# Patient Record
Sex: Male | Born: 1985 | Race: White | Hispanic: No | Marital: Single | Smoking: Current every day smoker
Health system: Southern US, Community
[De-identification: ages and names within clinical notes are randomized; demographics above are authoritative.]

---
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
## 2003-04-13 ENCOUNTER — Emergency Department (HOSPITAL_COMMUNITY): Admission: EM | Admit: 2003-04-13 | Discharge: 2003-04-13 | Payer: Self-pay | Admitting: *Deleted

## 2003-04-18 ENCOUNTER — Emergency Department (HOSPITAL_COMMUNITY): Admission: EM | Admit: 2003-04-18 | Discharge: 2003-04-18 | Payer: Self-pay | Admitting: Emergency Medicine

## 2003-07-14 ENCOUNTER — Encounter: Payer: Self-pay | Admitting: Family Medicine

## 2003-07-14 ENCOUNTER — Ambulatory Visit (HOSPITAL_COMMUNITY): Admission: RE | Admit: 2003-07-14 | Discharge: 2003-07-14 | Payer: Self-pay | Admitting: Family Medicine

## 2004-03-27 ENCOUNTER — Emergency Department (HOSPITAL_COMMUNITY): Admission: EM | Admit: 2004-03-27 | Discharge: 2004-03-27 | Payer: Self-pay

## 2004-08-11 ENCOUNTER — Emergency Department (HOSPITAL_COMMUNITY): Admission: EM | Admit: 2004-08-11 | Discharge: 2004-08-11 | Payer: Self-pay | Admitting: Emergency Medicine

## 2009-02-19 ENCOUNTER — Emergency Department (HOSPITAL_COMMUNITY): Admission: AC | Admit: 2009-02-19 | Discharge: 2009-02-20 | Payer: Self-pay | Admitting: Emergency Medicine

## 2009-04-20 ENCOUNTER — Emergency Department (HOSPITAL_COMMUNITY): Admission: EM | Admit: 2009-04-20 | Discharge: 2009-04-20 | Payer: Self-pay | Admitting: Emergency Medicine
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
## 2011-01-04 LAB — CBC
HCT: 44.8 % (ref 39.0–52.0)
Hemoglobin: 15.4 g/dL (ref 13.0–17.0)
MCHC: 34.3 g/dL (ref 30.0–36.0)
MCV: 88.1 fL (ref 78.0–100.0)
RBC: 5.08 MIL/uL (ref 4.22–5.81)
WBC: 9 10*3/uL (ref 4.0–10.5)

## 2011-01-04 LAB — SAMPLE TO BLOOD BANK

## 2021-01-01 ENCOUNTER — Other Ambulatory Visit: Payer: Self-pay

## 2021-01-01 ENCOUNTER — Encounter: Payer: Self-pay | Admitting: Emergency Medicine

## 2021-01-01 ENCOUNTER — Ambulatory Visit
Admission: EM | Admit: 2021-01-01 | Discharge: 2021-01-01 | Disposition: A | Discharge status: Home / Self Care | Payer: 59 | Attending: Internal Medicine | Admitting: Internal Medicine

## 2021-01-01 DIAGNOSIS — J301 Allergic rhinitis due to pollen: Secondary | ICD-10-CM

## 2021-01-01 MED ORDER — FEXOFENADINE HCL 180 MG PO TABS: 180.0000 mg | ORAL_TABLET | Freq: Every day | ORAL | 0 refills | Status: DC

## 2021-01-01 MED ORDER — FLUTICASONE PROPIONATE 50 MCG/ACT NA SUSP: 1.0000 | Freq: Two times a day (BID) | NASAL | 0 refills | Status: DC

## 2021-01-01 NOTE — ED Provider Notes (Signed)
[facility]    CSN: [id number] Arrival date & time: 01/01/21  1041      History   Chief Complaint No chief complaint on file.   HPI Alex Hardy is a 35 y.o. male who presents with nose congestion, sinus pressure x 2 days. Has not been sick at all in the past few weeks. Denies noticing itchy throat or nose or sneezing. Denies body aches, fever, fatigue, or GI symptoms. Has tried OTC meds without help.  Has been doing Netie pot rinses.    History reviewed. No pertinent past medical history.  There are no problems to display for this patient.   History reviewed. No pertinent surgical history.   Home Medications    Prior to Admission medications   Medication Sig Start Date End Date Taking? Authorizing Provider  fexofenadine (ALLEGRA ALLERGY) 180 MG tablet Take 1 tablet (180 mg total) by mouth daily. 01/01/21  Yes Rodriguez-Southworth, Nettie Elm, PA-C  fluticasone (FLONASE) 50 MCG/ACT nasal spray Place 1 spray into both nostrils in the morning and at bedtime. 01/01/21  Yes Rodriguez-Southworth, Viviana Simpler    Family History History reviewed. No pertinent family history.  Social History Social History   Tobacco Use  . Smoking status: Current Every Day Smoker    Packs/day: 0.50    Types: Cigarettes  . Smokeless tobacco: Never Used  Vaping Use  . Vaping Use: Never used  Substance Use Topics  . Alcohol use: Yes    Comment: 2 to 3 times a week  . Drug use: Never     Allergies   Patient has no known allergies.   Review of Systems Review of Systems  Constitutional: Negative for activity change, appetite change, chills, diaphoresis, fatigue and fever.  HENT: Positive for congestion, postnasal drip and rhinorrhea. Negative for ear discharge, ear pain, sneezing, sore throat and trouble swallowing.   Eyes: Negative for discharge.  Respiratory: Positive for cough. Negative for chest tightness and shortness of breath.   Cardiovascular: Negative for chest pain.   Gastrointestinal: Negative for diarrhea, nausea and vomiting.  Musculoskeletal: Negative for myalgias.  Skin: Negative for rash.  Allergic/Immunologic: Negative for environmental allergies.  Neurological: Negative for headaches.     Physical Exam Triage Vital Signs ED Triage Vitals  Enc Vitals Group     BP 01/01/21 1050 (!) 142/80     Pulse Rate 01/01/21 1050 (!) 110     Resp 01/01/21 1050 20     Temp 01/01/21 1050 98.1 F (36.7 C)     Temp Source 01/01/21 1050 Oral     SpO2 01/01/21 1050 97 %     Weight --      Height --      Head Circumference --      Peak Flow --      Pain Score 01/01/21 1052 7     Pain Loc --      Pain Edu? --      Excl. in GC? --    No data found.  Updated Vital Signs BP (!) 142/80   Pulse (!) 110   Temp 98.1 F (36.7 C) (Oral)   Resp 20   SpO2 97%   Visual Acuity Right Eye Distance:   Left Eye Distance:   Bilateral Distance:    Right Eye Near:   Left Eye Near:    Bilateral Near:     Physical Exam Alert pt NAD who seems nasally congested EYES- non icterus, mild watering, no purulent drainage NOSE- moderate mucosa  congestion which is pale pink with clear mucous. No sinus tenderness TM- both gray and little dull, canals are normal PHARYNX- clear, clear drainage noted.  NECK- supple with no nodes LUNGS- clear HEART - RRR with no murmurs SKIN- non jaundiced, no rashes.    UC Treatments / Results  Labs (all labs ordered are listed, but only abnormal results are displayed) Labs Reviewed - No data to display  EKG   Radiology No results found.  Procedures Procedures (including critical care time)  Medications Ordered in UC Medications - No data to display  Initial Impression / Assessment and Plan / UC Course  I have reviewed the triage vital signs and the nursing notes. Allergic rhinitis. I placed him on Allegra and Flonase as noted.  Final Clinical Impressions(s) / UC Diagnoses   Final diagnoses:  Seasonal allergic  rhinitis due to pollen   Discharge Instructions   None    ED Prescriptions    Medication Sig Dispense Auth. Provider   fexofenadine (ALLEGRA ALLERGY) 180 MG tablet Take 1 tablet (180 mg total) by mouth daily. 30 tablet Rodriguez-Southworth, Raul Winterhalter, PA-C   fluticasone (FLONASE) 50 MCG/ACT nasal spray Place 1 spray into both nostrils in the morning and at bedtime. 18.2 g Rodriguez-Southworth, Nettie Elm, PA-C     PDMP not reviewed this encounter.   Garey Ham, PA-C 01/01/21 1452

## 2021-01-01 NOTE — ED Triage Notes (Signed)
Nasal congestion with sinus pressure, since Wednesday.

## 2021-01-25 ENCOUNTER — Other Ambulatory Visit: Payer: Self-pay

## 2021-01-25 ENCOUNTER — Ambulatory Visit
Admission: EM | Admit: 2021-01-25 | Discharge: 2021-01-25 | Disposition: A | Discharge status: Home / Self Care | Payer: 59 | Attending: Family Medicine | Admitting: Family Medicine

## 2021-01-25 DIAGNOSIS — L2489 Irritant contact dermatitis due to other agents: Secondary | ICD-10-CM | POA: Diagnosis not present

## 2021-01-25 MED ORDER — DEXAMETHASONE SODIUM PHOSPHATE 10 MG/ML IJ SOLN
10.0000 mg | Freq: Once | INTRAMUSCULAR | Status: AC
  Administered 2021-01-25: 10 mg via INTRAMUSCULAR

## 2021-01-25 NOTE — ED Triage Notes (Signed)
Pt presents with rash on forearms for past week, believes to be poison ivy

## 2021-02-11 ENCOUNTER — Ambulatory Visit
Admission: EM | Admit: 2021-02-11 | Discharge: 2021-02-11 | Disposition: A | Discharge status: Home / Self Care | Payer: 59 | Attending: Emergency Medicine | Admitting: Emergency Medicine

## 2021-02-11 ENCOUNTER — Encounter: Payer: Self-pay | Admitting: Emergency Medicine

## 2021-02-11 ENCOUNTER — Other Ambulatory Visit: Payer: Self-pay

## 2021-02-11 DIAGNOSIS — J069 Acute upper respiratory infection, unspecified: Secondary | ICD-10-CM | POA: Diagnosis not present

## 2021-02-11 MED ORDER — MOLNUPIRAVIR EUA 200MG CAPSULE: 4.0000 | ORAL_CAPSULE | Freq: Two times a day (BID) | ORAL | 0 refills | Status: AC

## 2021-02-11 MED ORDER — BENZONATATE 100 MG PO CAPS: 100.0000 mg | ORAL_CAPSULE | Freq: Three times a day (TID) | ORAL | 0 refills | Status: AC

## 2021-02-11 NOTE — ED Triage Notes (Signed)
Body aches and chills, nausea, and nasal congestion since yesterday.  At home covid test was positive.

## 2021-02-11 NOTE — Discharge Instructions (Signed)
Get plenty of rest and push fluids Tessalon Perles as needed for cough Use OTC zyrtec for nasal congestion, runny nose, and/or sore throat Use OTC flonase for nasal congestion and runny nose Use medications daily for symptom relief Use OTC medications like ibuprofen or tylenol as needed fever or pain Call or go to the ED if you have any new or worsening symptoms such as fever, cough, shortness of breath, chest tightness, chest pain, turning blue, changes in mental status, etc..Marland Kitchen

## 2021-02-11 NOTE — ED Provider Notes (Signed)
[facility]   [id number] 02/11/21 Arrival Time: 1205   CC: COVID symptoms  SUBJECTIVE: History from: patient.  Alex Hardy is a 35 y.o. male who presents with body aches, chills, nausea, and nasal congestion x 1 day.  Denies sick exposure to COVID, flu or strep.   Denies alleviating or aggravating factors.  Denies previous symptoms in the past.   Denies SOB, wheezing, chest pain, changes in bowel or bladder habits.    At home covid test positive  ROS: As per HPI.  All other pertinent ROS negative.     History reviewed. No pertinent past medical history. History reviewed. No pertinent surgical history. No Known Allergies No current facility-administered medications on file prior to encounter.   Current Outpatient Medications on File Prior to Encounter  Medication Sig Dispense Refill  . [DISCONTINUED] fexofenadine (ALLEGRA ALLERGY) 180 MG tablet Take 1 tablet (180 mg total) by mouth daily. 30 tablet 0  . [DISCONTINUED] fluticasone (FLONASE) 50 MCG/ACT nasal spray Place 1 spray into both nostrils in the morning and at bedtime. 18.2 g 0   Social History   Socioeconomic History  . Marital status: Single    Spouse name: Not on file  . Number of children: Not on file  . Years of education: Not on file  . Highest education level: Not on file  Occupational History  . Not on file  Tobacco Use  . Smoking status: Current Every Day Smoker    Packs/day: 0.50    Types: Cigarettes  . Smokeless tobacco: Never Used  Vaping Use  . Vaping Use: Never used  Substance and Sexual Activity  . Alcohol use: Yes    Comment: 2 to 3 times a week  . Drug use: Never  . Sexual activity: Not on file  Other Topics Concern  . Not on file  Social History Narrative  . Not on file   Social Determinants of Health   Financial Resource Strain: Not on file  Food Insecurity: Not on file  Transportation Needs: Not on file  Physical Activity: Not on file  Stress: Not on file  Social  Connections: Not on file  Intimate Partner Violence: Not on file   Family History  Problem Relation Age of Onset  . Healthy Mother   . Healthy Father     OBJECTIVE:  Vitals:   02/11/21 1346  BP: 126/82  Pulse: (!) 106  Resp: 18  Temp: (!) 100.7 F (38.2 C)  TempSrc: Temporal  SpO2: 96%     General appearance: alert; appears fatigued, but nontoxic; speaking in full sentences and tolerating own secretions HEENT: NCAT; Ears: EACs clear, TMs pearly gray; Eyes: PERRL.  EOM grossly intact. Nose: nares patent without rhinorrhea, Throat: oropharynx clear, tonsils non erythematous or enlarged, uvula midline  Neck: supple without LAD Lungs: unlabored respirations, symmetrical air entry; cough: absent; no respiratory distress; CTAB Heart: regular rate and rhythm.  Skin: warm and dry Psychological: alert and cooperative; normal mood and affect  ASSESSMENT & PLAN:  1. Viral URI with cough     Meds ordered this encounter  Medications  . benzonatate (TESSALON) 100 MG capsule    Sig: Take 1 capsule (100 mg total) by mouth every 8 (eight) hours.    Dispense:  21 capsule    Refill:  0    Order Specific Question:   Supervising Provider    Answer:   Eustace Moore [[id number]]  . molnupiravir EUA 200 mg CAPS    Sig: Take  4 capsules (800 mg total) by mouth 2 (two) times daily for 5 days.    Dispense:  40 capsule    Refill:  0    Order Specific Question:   Supervising Provider    Answer:   Eustace Moore [[id number]]   Get plenty of rest and push fluids Tessalon Perles as needed for cough Use OTC zyrtec for nasal congestion, runny nose, and/or sore throat Use OTC flonase for nasal congestion and runny nose Use medications daily for symptom relief Use OTC medications like ibuprofen or tylenol as needed fever or pain Call or go to the ED if you have any new or worsening symptoms such as fever, cough, shortness of breath, chest tightness, chest pain, turning blue, changes in mental  status, etc...   molnupiravir prescribed.    Reviewed expectations re: course of current medical issues. Questions answered. Outlined signs and symptoms indicating need for more acute intervention. Patient verbalized understanding. After Visit Summary given.         Rennis Harding, PA-C 02/11/21 1430

## 2022-02-16 ENCOUNTER — Ambulatory Visit
Admission: EM | Admit: 2022-02-16 | Discharge: 2022-02-16 | Disposition: A | Discharge status: Home / Self Care | Payer: 59 | Attending: Family Medicine | Admitting: Family Medicine

## 2022-02-16 DIAGNOSIS — H6122 Impacted cerumen, left ear: Secondary | ICD-10-CM

## 2022-02-16 NOTE — ED Triage Notes (Addendum)
Pt states his left ear has been stopped up since last Sunday  Pt states he can not hear out of his left ear but no pain  Denies Fever

## 2022-02-20 NOTE — ED Provider Notes (Signed)
[facility]    CSN: [id number] Arrival date & time: 02/16/22  7262      History   Chief Complaint Chief Complaint  Patient presents with   ears stopped up    HPI Alex Hardy is a 36 y.o. male.   Presenting today with left ear fullness, muffled hearing for about a week. Denies drainage, fever, chills, headache, recent illness, hx of chronic ear issues. Not trying anything OTC for sxs.    History reviewed. No pertinent past medical history.  There are no problems to display for this patient.   History reviewed. No pertinent surgical history.     Home Medications    Prior to Admission medications   Medication Sig Start Date End Date Taking? Authorizing Provider  benzonatate (TESSALON) 100 MG capsule Take 1 capsule (100 mg total) by mouth every 8 (eight) hours. 02/11/21   Wurst, [place], PA-C  fexofenadine (ALLEGRA ALLERGY) 180 MG tablet Take 1 tablet (180 mg total) by mouth daily. 01/01/21 02/11/21  Rodriguez-Southworth, Nettie Elm, PA-C  fluticasone (FLONASE) 50 MCG/ACT nasal spray Place 1 spray into both nostrils in the morning and at bedtime. 01/01/21 02/11/21  Rodriguez-Southworth, Nettie Elm, PA-C    Family History Family History  Problem Relation Age of Onset   Healthy Mother    Healthy Father     Social History Social History   Tobacco Use   Smoking status: Every Day    Packs/day: 0.50    Types: Cigarettes   Smokeless tobacco: Never  Vaping Use   Vaping Use: Never used  Substance Use Topics   Alcohol use: Yes    Comment: 2 to 3 times a week   Drug use: Never     Allergies   Patient has no known allergies.   Review of Systems Review of Systems PER HPI  Physical Exam Triage Vital Signs ED Triage Vitals  Enc Vitals Group     BP 02/16/22 0828 (!) 149/96     Pulse Rate 02/16/22 0828 84     Resp 02/16/22 0828 18     Temp 02/16/22 0828 98.2 F (36.8 C)     Temp Source 02/16/22 0828 Oral     SpO2 02/16/22 0828 97 %     Weight --       Height --      Head Circumference --      Peak Flow --      Pain Score 02/16/22 0829 0     Pain Loc --      Pain Edu? --      Excl. in GC? --    No data found.  Updated Vital Signs BP (!) 149/96 (BP Location: Right Arm)   Pulse 84   Temp 98.2 F (36.8 C) (Oral)   Resp 18   SpO2 97%   Visual Acuity Right Eye Distance:   Left Eye Distance:   Bilateral Distance:    Right Eye Near:   Left Eye Near:    Bilateral Near:     Physical Exam Vitals and nursing note reviewed.  Constitutional:      Appearance: Normal appearance.  HENT:     Head: Atraumatic.     Right Ear: Tympanic membrane normal.     Left Ear: There is impacted cerumen.  Eyes:     Extraocular Movements: Extraocular movements intact.     Conjunctiva/sclera: Conjunctivae normal.  Cardiovascular:     Rate and Rhythm: Normal rate and regular rhythm.  Pulmonary:  Effort: Pulmonary effort is normal.     Breath sounds: Normal breath sounds.  Musculoskeletal:        General: Normal range of motion.     Cervical back: Normal range of motion and neck supple.  Skin:    General: Skin is warm and dry.  Neurological:     General: No focal deficit present.     Mental Status: He is oriented to person, place, and time.  Psychiatric:        Mood and Affect: Mood normal.        Thought Content: Thought content normal.        Judgment: Judgment normal.     UC Treatments / Results  Labs (all labs ordered are listed, but only abnormal results are displayed) Labs Reviewed - No data to display  EKG   Radiology No results found.  Procedures Procedures (including critical care time)  Medications Ordered in UC Medications - No data to display  Initial Impression / Assessment and Plan / UC Course  I have reviewed the triage vital signs and the nursing notes.  Pertinent labs & imaging results that were available during my care of the patient were reviewed by me and considered in my medical decision making (see  chart for details).     Lavage performed  with warm water and peroxide with relief of impaction, TM visualized and benign post procedure.  Final Clinical Impressions(s) / UC Diagnoses   Final diagnoses:  Impacted cerumen of left ear   Discharge Instructions   None    ED Prescriptions   None    PDMP not reviewed this encounter.   Particia Nearing, [place] 02/20/22 2123

## 2022-11-30 ENCOUNTER — Other Ambulatory Visit (HOSPITAL_COMMUNITY): Payer: Self-pay | Admitting: Nurse Practitioner

## 2022-11-30 DIAGNOSIS — R945 Abnormal results of liver function studies: Secondary | ICD-10-CM

## 2022-12-12 ENCOUNTER — Ambulatory Visit (HOSPITAL_COMMUNITY)
Admission: RE | Admit: 2022-12-12 | Discharge: 2022-12-12 | Disposition: A | Discharge status: Home / Self Care | Payer: Commercial Managed Care - PPO | Source: Ambulatory Visit | Attending: Nurse Practitioner | Admitting: Nurse Practitioner

## 2022-12-12 DIAGNOSIS — R945 Abnormal results of liver function studies: Secondary | ICD-10-CM | POA: Insufficient documentation

## 2024-06-03 ENCOUNTER — Other Ambulatory Visit (HOSPITAL_COMMUNITY): Payer: Self-pay

## 2024-06-03 DIAGNOSIS — R945 Abnormal results of liver function studies: Secondary | ICD-10-CM

## 2024-06-03 DIAGNOSIS — F17200 Nicotine dependence, unspecified, uncomplicated: Secondary | ICD-10-CM

## 2024-06-13 ENCOUNTER — Ambulatory Visit (HOSPITAL_COMMUNITY)
Admission: RE | Admit: 2024-06-13 | Discharge: 2024-06-13 | Disposition: A | Discharge status: Home / Self Care | Source: Ambulatory Visit

## 2024-06-13 DIAGNOSIS — F17200 Nicotine dependence, unspecified, uncomplicated: Secondary | ICD-10-CM | POA: Diagnosis present

## 2024-06-13 DIAGNOSIS — R945 Abnormal results of liver function studies: Secondary | ICD-10-CM | POA: Diagnosis present
